# Patient Record
Sex: Male | Born: 1975 | Race: White | Hispanic: No | Marital: Married
Health system: Midwestern US, Community
[De-identification: ages and names within clinical notes are randomized; demographics above are authoritative.]

---

## 2017-11-21 ENCOUNTER — Inpatient Hospital Stay
Admit: 2017-11-21 | Discharge: 2017-11-21 | Payer: PRIVATE HEALTH INSURANCE | Attending: Family | Primary: Family Medicine

## 2017-11-21 DIAGNOSIS — M79674 Pain in right toe(s): Secondary | ICD-10-CM

## 2017-11-21 MED ORDER — HYDROCODONE-ACETAMINOPHEN 5-325 MG PO TABS
5-325 MG | ORAL_TABLET | Freq: Two times a day (BID) | ORAL | 0 refills | Status: AC | PRN
Start: 2017-11-21 — End: 2018-01-13

## 2017-11-21 NOTE — Progress Notes (Addendum)
Nursing Admission Record    Current Issues / Falls / ER Visits:  Yes   Yes kidney stones    Percentage of Pain Relief after Last Procedure:  na %    How long lasted:  0 days    Opioids Prescribed: Yes    Was Medication Brought to Appt: no    Hx of any Neck/Back Surgeries? no    Is Patient currently taking a blood thinner? no    MRI exams received in the past 2 years:  No       When                                               Where       Imaging on chart: No         Imaging records requested: No    CT exam received during the last 12 months: Yes abd and pelvis       When 07/19                                           Where [facility]       Imaging on chart: Yes         Imaging records requested: No    X-ray exam received during the last 12 months: Yeschest       When 7/19                                              [facility]        Imaging on chart: No         Imaging records requested: No    Nerve Conduction Study/EMG:  No       When                                               Where       Imaging on chart: No         Imaging records requested: No    Physical therapy during the last 6 months: No       When:                         Where     Labs during the last 12 months: Yes       When: 11/21/17                                             Where  [facility]    PEG Score: 9    Last PEG Score: na  Annual ORT Score: 4    Annual PHQ Score:5  Last UDS Results: 11/21/17    Education Provided:  [x]  Review of [facility]  [x]  Agreement Review  [x]  PEG Score Calculated [x]  PHQ Score Calculated [x]  ORT Score Calculated    []  Compliance Issues  Discussed []  Cognitive Behavior Needs [x]  Exercise []  Review of Test []  Financial Issues  [x]  Tobacco/Alcohol Use Reviewed [x]  Teaching []  New Patient [x]  Picture Obtained    Physician Plan:  []  Outgoing Referral  []  Pharmacy Consult  [x]  Test Ordered [x]  Prescription Ordered/Changed x1 []  Blood Thinner Request Form  []  Obtained Test Results / Consult Notes  []  UDS due at next visit,  verified per EPIC      []  Suspected Physical Abuse or Suicide Risk assessed - IF YES COMPLETE QUESTIONS BELOW    If any of the following questions are answered yes - contact attending physician for referral:    Has been considering harming self to escape stress, pain problems?  []  YES  []  NO  Has a suicide plan? []  YES  []  NO  Has attempted suicide in the past?   []  YES  []  NO  Has a close friend or family member who committed suicide?  []  YES  []  NO    Assessment Completed by:  Electronically signed by Lucy Antigua, RN on 11/21/2017 at 1:37 PM

## 2017-11-21 NOTE — H&P (Signed)
[facility]    History and Physical    Patient Name: Joshua Bush    MR #: [id number]    Account [id number]    DOB: [birth year]    Age: 42 y.o.    Sex: male    Date: 11/21/2017    PCP: Keene Breath, MD    Referring Provider: Primary care    Chief Complaint:   Chief Complaint   Patient presents with   ??? Foot Pain     feet bilateral       History of Present Illness:   The patient is a 42 y.o. male who presents with referral from primary care which I do appreciate with primary complaints of planes of chronic bilateral leg and feet pain.  No loss of bowel bladder.  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  Patient reports chronic history of lower leg and feet pain.  Physical therapy in the past -opiate medications.  Patient referral from primary care after "aberrant drug screen from primary care apparently took oxycodone from a family member".  Patient recently released from Dr. 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  Currently taken Cymbalta, Neurontin, and one prescription of Norco 5 tapering dose from primary care.       Current PEG: 9    Annual Screens: Primary care    ORT Score: 4    PHQ-9 Score: 5    Current Pain Assessment  Pain Assessment  Pain Assessment: 0-10  Pain Level: 10  Patient's Stated Pain Goal: No pain  Pain Type: Chronic pain  Pain Location: Foot(bilateral)  Pain Descriptors: Stabbing, Throbbing  Pain Frequency: Continuous  Pain Onset: On-going  Clinical Progression: Gradually worsening  Functional Pain Assessment: Prevents or interferes some active activities and ADLs  Non-Pharmaceutical Pain Intervention(s): (bath)    Employment:  nka    Previous Injury: No    Previous Physical Therapy In the last 6 months? No    Did Physical Therapy make the pain better?  No     MRI in the two last year?  No  OF: nka see images below    CT scan in last 12 months? Yes    X-ray last 12 months? Yes    Nerve Conduction Study / EMG No    Injections in the past? Yes    Did the injections help relieve the pain? No    Past Medical History  Past Medical History:   Diagnosis Date   ??? Arthritis    ??? Arthrogryposis    ??? CAD (coronary artery disease)    ??? GERD (gastroesophageal reflux disease)    ??? Hyperlipidemia    ??? Hypertension        Medications  Current Outpatient Medications   Medication Sig Dispense Refill   ??? omeprazole (PRILOSEC) 40 MG delayed release capsule TAKE 1 CAPSULE BY MOUTH ONCE DAILY     ??? metoprolol succinate (TOPROL XL) 50 MG extended release tablet Take 50 mg by mouth     ??? varenicline (CHANTIX) 1 MG tablet      ??? tamsulosin (FLOMAX) 0.4 MG capsule Take 0.4 mg by mouth     ??? DULoxetine (CYMBALTA) 60 MG extended release  capsule Take 60 mg by mouth     ??? gabapentin (NEURONTIN) 100 MG capsule Take 100 mg by mouth 2 times daily.     ??? [START ON 12/14/2017] HYDROcodone-acetaminophen (NORCO) 5-325 MG per tablet Take 1 tablet by mouth 2 times daily as needed for Pain for up to 30 days. 60 tablet 0   ??? rOPINIRole (REQUIP) 0.5 MG tablet TAKE 1 TABLET BY MOUTH NIGHTLY ONE HOUR BEFORE BEDTIME  0   ??? aspirin EC 81 MG EC tablet Take 81 mg by mouth     ??? nitroGLYCERIN (NITROSTAT) 0.4 MG SL tablet DISSOLVE ONE TABLET UNDER THE TONGUE EVERY 5 MINUTES AS NEEDED FOR CHEST PAIN. DO NOT EXCEED A TOTAL OF 3 DOSES IN 15 MINUTES  2   ??? ondansetron (ZOFRAN) 4 MG tablet TAKE 1 TABLET BY MOUTH 4 TIMES DAILY  1   ??? atorvastatin (LIPITOR) 80 MG tablet TAKE 1 TABLET BY MOUTH ONCE DAILY  11   ??? ranitidine (ZANTAC) 150 MG tablet TAKE 1 TABLET BY MOUTH AT NIGHT  2   ??? BRILINTA 60 MG TABS tablet TAKE 1 TABLET BY MOUTH TWICE DAILY  11     No current facility-administered medications  for this encounter.        Allergies  Latex; Dust mite extract; Peanut-containing drug products; Shrimp flavor; and Walnut [macadamia nut oil]    Current Medications  Current Outpatient Medications   Medication Sig Dispense Refill   ??? omeprazole (PRILOSEC) 40 MG delayed release capsule TAKE 1 CAPSULE BY MOUTH ONCE DAILY     ??? metoprolol succinate (TOPROL XL) 50 MG extended release tablet Take 50 mg by mouth     ??? varenicline (CHANTIX) 1 MG tablet      ??? tamsulosin (FLOMAX) 0.4 MG capsule Take 0.4 mg by mouth     ??? DULoxetine (CYMBALTA) 60 MG extended release capsule Take 60 mg by mouth     ??? gabapentin (NEURONTIN) 100 MG capsule Take 100 mg by mouth 2 times daily.     ??? [START ON 12/14/2017] HYDROcodone-acetaminophen (NORCO) 5-325 MG per tablet Take 1 tablet by mouth 2 times daily as needed for Pain for up to 30 days. 60 tablet 0   ??? rOPINIRole (REQUIP) 0.5 MG tablet TAKE 1 TABLET BY MOUTH NIGHTLY ONE HOUR BEFORE BEDTIME  0   ??? aspirin EC 81 MG EC tablet Take 81 mg by mouth     ??? nitroGLYCERIN (NITROSTAT) 0.4 MG SL tablet DISSOLVE ONE TABLET UNDER THE TONGUE EVERY 5 MINUTES AS NEEDED FOR CHEST PAIN. DO NOT EXCEED A TOTAL OF 3 DOSES IN 15 MINUTES  2   ??? ondansetron (ZOFRAN) 4 MG tablet TAKE 1 TABLET BY MOUTH 4 TIMES DAILY  1   ??? atorvastatin (LIPITOR) 80 MG tablet TAKE 1 TABLET BY MOUTH ONCE DAILY  11   ??? ranitidine (ZANTAC) 150 MG tablet TAKE 1 TABLET BY MOUTH AT NIGHT  2   ??? BRILINTA 60 MG TABS tablet TAKE 1 TABLET BY MOUTH TWICE DAILY  11     No current facility-administered medications for this encounter.        Social History    Social History     Socioeconomic History   ??? Marital status: Married     Spouse name: None   ??? Number of children: None   ??? Years of education: None   ??? Highest education level: None   Occupational History   ??? None   Social Needs   ???  Financial resource strain: None   ??? Food insecurity:     Worry: None     Inability: None   ??? Transportation needs:     Medical: None     Non-medical: None    Tobacco Use   ??? Smoking status: Current Every Day Smoker     Packs/day: 0.50   ??? Smokeless tobacco: Never Used   Substance and Sexual Activity   ??? Alcohol use: Not Currently     Frequency: Never   ??? Drug use: Never   ??? Sexual activity: None   Lifestyle   ??? Physical activity:     Days per week: None     Minutes per session: None   ??? Stress: None   Relationships   ??? Social connections:     Talks on phone: None     Gets together: None     Attends religious service: None     Active member of club or organization: None     Attends meetings of clubs or organizations: None     Relationship status: None   ??? Intimate partner violence:     Fear of current or ex partner: None     Emotionally abused: None     Physically abused: None     Forced sexual activity: None   Other Topics Concern   ??? None   Social History Narrative   ??? None         Family History  family history is not on file.    Review of Systems:  Review of Systems   Constitutional: Positive for fatigue. Negative for chills and fever.   HENT: Negative for nosebleeds and sore throat.    Eyes: Negative for pain and redness.   Respiratory: Negative for shortness of breath and wheezing.    Cardiovascular: Negative for chest pain.   Gastrointestinal: Negative for abdominal pain and constipation.   Genitourinary: Negative for dysuria and frequency.   Musculoskeletal: Positive for arthralgias ( History congenital bilateral lower leg bone issues.), gait problem and myalgias.   Skin: Negative for rash.   Neurological: Negative for seizures and syncope.   Hematological: Does not bruise/bleed easily.   Psychiatric/Behavioral: Negative for dysphoric mood and suicidal ideas.   All other systems reviewed and are negative.       14 point ROS negative besides that noted in HPI      No data found.    Body mass index is 32.82 kg/m??.    Physical Exam   Constitutional: He is oriented to person, place, and time. He appears well-developed and well-nourished. No distress.   HENT:   Head:  Normocephalic.   Right Ear: External ear normal.   Left Ear: External ear normal.   Nose: Nose normal.   Eyes: Pupils are equal, round, and reactive to light. Conjunctivae and EOM are normal. Right eye exhibits no discharge. Left eye exhibits no discharge.   Neck: Normal range of motion. Neck supple.   Cardiovascular: Normal rate and regular rhythm.   Pulmonary/Chest: Effort normal and breath sounds normal. He has no wheezes.   Abdominal: Soft. Bowel sounds are normal. There is no tenderness. There is no guarding.   Musculoskeletal: He exhibits tenderness.        Left ankle: Tenderness.        Left forearm: He exhibits tenderness.        Right lower leg: He exhibits tenderness.        Left lower leg: He exhibits tenderness.  Legs:  Neurological: He is alert and oriented to person, place, and time. He displays atrophy (Bilateral lower legs). He exhibits normal muscle tone. Coordination and gait normal.   Reflex Scores:       Tricep reflexes are 2+ on the right side and 2+ on the left side.       Bicep reflexes are 2+ on the right side and 2+ on the left side.       Brachioradialis reflexes are 2+ on the right side and 2+ on the left side.  Surgical scar left wrist-weakness    With surgical bilateral ankle scarring flexed position-bilateral strap on bracing   Skin: Skin is warm and dry. No rash noted. He is not diaphoretic. No erythema.   Psychiatric: His behavior is normal. Judgment and thought content normal. His mood appears anxious. He is not aggressive and not hyperactive. He expresses no homicidal and no suicidal ideation.   Alert and verbal   Nursing note and vitals reviewed.      LAST UDS: Patient with oxycodone positive UDS from primary care recently, released from pain management recently from methamphetamine positive UDS.  Will obtain UDS today    LABS:     No results found for: NA, K, CL, CO2, BUN, CREATININE, GLUCOSE, CALCIUM     No results found for: WBC, HGB, HCT, MCV, PLT    Recent  NVC/EMG:nka    Recent Imaging:nka                                                                                                                                       Principal Problem:    Chronic pain of both lower extremities  Active Problems:    Chronic pain of toes of both feet    Pain management    Pain in joints of both feet    Arthrogryposis  Resolved Problems:    * No resolved hospital problems. *      PLAN:  1.  I discussed the opiate prescribing policy of this facility and FDA guidelines.  2.  Patient will sign for last pain management images and notes  3.  I suggested that he maintains behavioral health group follow-up  4.  Obtain UDS today.  5. Will overtake norco 5mg  BID - #60- pt understands this may be titration pending UDS.   6. Pt is interested in Bilateral ankle scarring TPI- Dr. Venetia Night ( pt perfers lidocaine only).   7.  Will obtain bilateral ankle xray.   8. Follow up 1 month.    Discussion: Discussed exam findings and plan of care with patient. Patient agreed with POC and questions were asked and answered.  New patient evaluation today appreciate primary care.  Patient with a history of congenital lower extremity issue.  Multiple surgeries bilateral lower feet.  She was on for last pain management notes and images.  Will over take Norco 5 mg  twice daily #60 we discussed titration of this medication as we obtained UDS today.  Patient is interested in bilateral ankle scarring trigger point injections pain only.  With supposedly apparent oxycodone positive from primary care UDS and apparently methamphetamine positive from last pain management.  Discussed any aberrancy may lead to discharge at this facility or of opiate medications.  Patient agrees to move forward    Activity: discussed exercise as beneficial to pain reduction, encouraged stretching exercise with a focus ontorso strengthening.    Education Provided by Provider  Review of Zadie Rhine / Agreement Review / ORT Calculated / PHQ-9  Reviewed / Compliance Issues Discussed / Cognitive Behavior Needs / Exercise / Review of Test / Financial Issues / Teaching / Smoking Cessation / Tobacco/Alcohol Use    Controlled Substance Monitoring:  Discussed possible narcotic pain medication side effects, risk of tolerance and/or dependence, and alternative treatments. Discussed the effects of long term narcotic use. Patient encouraged to set daily goals of exercising and if on narcotics to decrease daily narcotic intake.     CC:  Keene Breath, MD    Thank you for this kind referral and allowing me to participate in your patients care.    Derek Mound, APRN, 12/02/2017 at10:31 AM    EMR dragon/transcription disclaimer: Much of this encounter note is electronic transcription/translation of spoken language to printed tach. Electronic translation of spoken language may be erroneous, or at times, nonsensical words or phrases may be inadvertently transcribed. Although, I have reviewed the note for such errors, some may still exist.

## 2017-12-04 ENCOUNTER — Inpatient Hospital Stay: Admit: 2017-12-04 | Payer: PRIVATE HEALTH INSURANCE | Primary: Family Medicine

## 2017-12-04 DIAGNOSIS — M79604 Pain in right leg: Secondary | ICD-10-CM

## 2017-12-06 ENCOUNTER — Inpatient Hospital Stay
Admit: 2017-12-06 | Payer: PRIVATE HEALTH INSURANCE | Attending: Physical Medicine & Rehabilitation | Primary: Family Medicine

## 2017-12-06 DIAGNOSIS — M25572 Pain in left ankle and joints of left foot: Secondary | ICD-10-CM

## 2017-12-06 MED ORDER — BUPIVACAINE HCL (PF) 0.5 % IJ SOLN
0.5 % | Freq: Once | INTRAMUSCULAR | Status: AC | PRN
Start: 2017-12-06 — End: 2017-12-06
  Administered 2017-12-06: 13:00:00 7 via INTRADERMAL

## 2017-12-06 MED ORDER — BUPIVACAINE HCL (PF) 0.5 % IJ SOLN
0.5 | INTRAMUSCULAR | Status: AC
Start: 2017-12-06 — End: 2017-12-06

## 2017-12-06 MED ORDER — LIDOCAINE HCL (PF) 1 % IJ SOLN
1 % | Freq: Once | INTRAMUSCULAR | Status: AC | PRN
Start: 2017-12-06 — End: 2017-12-06
  Administered 2017-12-06: 13:00:00 7 via INTRADERMAL

## 2017-12-06 MED ORDER — LIDOCAINE HCL (PF) 1 % IJ SOLN
1 | INTRAMUSCULAR | Status: AC
Start: 2017-12-06 — End: 2017-12-06

## 2017-12-06 MED FILL — LIDOCAINE HCL (PF) 1 % IJ SOLN: 1 % | INTRAMUSCULAR | Qty: 30

## 2017-12-06 MED FILL — SENSORCAINE-MPF 0.5 % IJ SOLN: 0.5 % | INTRAMUSCULAR | Qty: 10

## 2017-12-06 NOTE — H&P (Signed)
See H&P in chart

## 2017-12-06 NOTE — Interval H&P Note (Signed)
 H&P Update     Patient examined.    There has been no change.    Joshua Bush

## 2017-12-09 NOTE — Telephone Encounter (Signed)
 Dr. Venetia Night Follow up calls       1.  How are you feeling since your injection?  Doing pretty bad, The second day he walked a little bit but has since he has been in bed.    2.   Pain Score:  Prior-  8           Now-  10    3. Do you feel like Dr. Venetia Night got to the source of your pain?  First day could walk for an hour.    4. Did it relieve at least 80% or more of your pain?  no    5. Does the pain still go down one leg or arm or both legs or arms?  Both feet hurting

## 2017-12-11 NOTE — Telephone Encounter (Signed)
Sending non narcotic letter per NP.  UDS positive for benzos, gabapentin, and oxymorphone which are not prescribed per [facility] report.     Per Joshua Bush it is okay to give him a weaning dose of norco 5 mg 1 daily for 2 weeks if the patient calls for a refill.

## 2017-12-25 ENCOUNTER — Inpatient Hospital Stay
Admit: 2017-12-25 | Discharge: 2017-12-25 | Payer: PRIVATE HEALTH INSURANCE | Attending: Family | Primary: Family Medicine

## 2017-12-25 DIAGNOSIS — M25572 Pain in left ankle and joints of left foot: Secondary | ICD-10-CM

## 2017-12-25 NOTE — Progress Notes (Addendum)
[facility] Physical & Pain Medicine  Office Note    Patient Name: Joshua Bush    MR #: [id number]    Account #: [id number]    DOB: 12-26-1975    Age: 42 y.o.    Sex: male    Date: 12/25/2017    PCP: Keene Breath, MD    Chief Complaint:   Chief Complaint   Patient presents with   ??? Ankle Pain     bilateral       History of Present Illness:     Joshua Bush is a 42 y.o. male who presents to the office for chronic complaint of lower leg and feet pain.  Was a new patient 11/21/2017 history congenital arthrogryposis.  Patient had aberrant drug screen from primary care "oxycodone from a family member".  Was seen pain management Dr. Valinda Hoar " mephampetamine positive".  UDS was obtained with apparent results "benzodiazepines, gabapentin and oxymorphone".  And nonnarcotic letter sent to patient. he does report bilateral scarring ankle injections via Dr. Lillia Carmel help for 1 hour.  Patient with wife in room I asked for permission to speak he agrees that "whatever you say she got hear to".  Patient has successfully titrated off the Norco.  He asked why we did not refill the Norco.  I asked about his "nonnarcotic letter he got in the mail".  Patient and wife report "they never received anything in the mail".    Past Visit HPI: I have read last pain management note    Current PEG:9    Past PEG: 9    Annual Screens: Primary care Dr. Larinda Buttery    ORT Score:4-patient with apparent drug screen primary care, pain management, and at this facility    PHQ-9 Score: 5    Current Pain Assessment  Pain Assessment  Pain Assessment: 0-10  Pain Level: 10  Patient's Stated Pain Goal: No pain  Pain Type: Chronic pain  Pain Location: Ankle  Pain Orientation: (bilateral)  Pain Descriptors: Aching, Stabbing(feels like ankles are in a vice)  Pain Frequency: Continuous  Pain Onset: On-going  Clinical Progression: Gradually worsening  Functional Pain Assessment: Prevents or interferes some active activities and ADLs    Employment:     Past  Medical Histoy  Past Medical History:   Diagnosis Date   ??? Arthritis    ??? Arthrogryposis    ??? CAD (coronary artery disease)    ??? GERD (gastroesophageal reflux disease)    ??? Hyperlipidemia    ??? Hypertension        Surgery History  Past Surgical History:   Procedure Laterality Date   ??? CHOLECYSTECTOMY, LAPAROSCOPIC     ??? OTHER SURGICAL HISTORY      6 surguries on lt fott 4 on rt foot 2 on lt wrist        Family History  family history is not on file.    Social History    Social History     Tobacco Use   ??? Smoking status: Current Every Day Smoker     Packs/day: 0.50   ??? Smokeless tobacco: Never Used   Substance Use Topics   ??? Alcohol use: Not Currently     Frequency: Never       Allergies  Latex; Dust mite extract; Peanut-containing drug products; Shrimp flavor; Shrimp extract allergy skin test; and Walnut [macadamia nut oil]     Current Medications  Current Outpatient Medications   Medication Sig Dispense Refill   ??? omeprazole (PRILOSEC)  40 MG delayed release capsule TAKE 1 CAPSULE BY MOUTH ONCE DAILY     ??? metoprolol succinate (TOPROL XL) 50 MG extended release tablet Take 50 mg by mouth     ??? varenicline (CHANTIX) 1 MG tablet      ??? rOPINIRole (REQUIP) 0.5 MG tablet TAKE 1 TABLET BY MOUTH NIGHTLY ONE HOUR BEFORE BEDTIME  0   ??? aspirin EC 81 MG EC tablet Take 81 mg by mouth     ??? tamsulosin (FLOMAX) 0.4 MG capsule Take 0.4 mg by mouth     ??? DULoxetine (CYMBALTA) 60 MG extended release capsule Take 60 mg by mouth     ??? nitroGLYCERIN (NITROSTAT) 0.4 MG SL tablet DISSOLVE ONE TABLET UNDER THE TONGUE EVERY 5 MINUTES AS NEEDED FOR CHEST PAIN. DO NOT EXCEED A TOTAL OF 3 DOSES IN 15 MINUTES  2   ??? ondansetron (ZOFRAN) 4 MG tablet TAKE 1 TABLET BY MOUTH 4 TIMES DAILY  1   ??? atorvastatin (LIPITOR) 80 MG tablet TAKE 1 TABLET BY MOUTH ONCE DAILY  11   ??? ranitidine (ZANTAC) 150 MG tablet TAKE 1 TABLET BY MOUTH AT NIGHT  2   ??? BRILINTA 60 MG TABS tablet TAKE 1 TABLET BY MOUTH TWICE DAILY  11   ??? gabapentin (NEURONTIN) 100 MG  capsule Take 100 mg by mouth 2 times daily.     ??? HYDROcodone-acetaminophen (NORCO) 5-325 MG per tablet Take 1 tablet by mouth 2 times daily as needed for Pain for up to 30 days. 60 tablet 0     No current facility-administered medications for this encounter.        Review of Systems:  Review of Systems   Constitutional: Negative for chills and fever.        WC for long distance mobility   HENT: Negative for nosebleeds and sore throat.    Eyes: Negative for pain.   Respiratory: Negative for shortness of breath and wheezing.    Cardiovascular: Negative for chest pain.   Gastrointestinal: Negative for abdominal pain and constipation.   Genitourinary: Negative for dysuria and frequency.   Musculoskeletal: Positive for arthralgias ( Bilateral lower leg congenital), gait problem and myalgias.   Skin: Negative for rash.   Neurological: Negative for seizures and syncope.   Hematological: Does not bruise/bleed easily.   Psychiatric/Behavioral: Positive for agitation. Negative for suicidal ideas.       14 point ROS negative besides that noted in HPI    Physical Exam:    Vitals:    12/25/17 1349   BP: 95/64   Pulse: 93   Resp: 16   Temp: 97.7 ??F (36.5 ??C)   Weight: 187 lb 4 oz (84.9 kg)   Height: 5\' 5"  (1.651 m)       Body mass index is 31.16 kg/m??.    Physical Exam   Constitutional: He is oriented to person, place, and time. He appears well-developed and well-nourished. No distress.   HENT:   Head: Normocephalic.   Right Ear: External ear normal.   Left Ear: External ear normal.   Nose: Nose normal.   Eyes: Pupils are equal, round, and reactive to light. Conjunctivae and EOM are normal. Right eye exhibits no discharge. Left eye exhibits no discharge.   Neck: Normal range of motion. Neck supple.   Cardiovascular: Normal rate and regular rhythm.   Pulmonary/Chest: Effort normal and breath sounds normal. No stridor. He has no wheezes.   Abdominal: Soft. Bowel sounds are normal. There is  no tenderness.   Musculoskeletal: He  exhibits tenderness and deformity.        Right ankle: Tenderness.        Left ankle: Tenderness.        Right foot: There is tenderness.        Left foot: There is tenderness.   Patient wearing shoes and boots   Neurological: He is alert and oriented to person, place, and time. Gait abnormal. Coordination normal.   Reflex Scores:       Tricep reflexes are 2+ on the right side and 2+ on the left side.       Bicep reflexes are 2+ on the right side and 2+ on the left side.       Brachioradialis reflexes are 2+ on the right side and 2+ on the left side.  Patient wearing shoes and boots   Skin: Skin is warm and dry. No rash noted. He is not diaphoretic. No erythema.   Psychiatric: His behavior is normal. Judgment and thought content normal. His mood appears anxious. He is not aggressive and not hyperactive. He expresses no homicidal and no suicidal ideation.   Alert and verbal   Nursing note and vitals reviewed.      LAST UDS: Apparent UDS    Labs:  No results found for: NA, K, CL, CO2, BUN, CREATININE, GLUCOSE, CALCIUM     No results found for: WBC, HGB, HCT, MCV, PLT    Recent Imaging:  EXAMINATION: XR ANKLE LEFT (MIN 3 VIEWS) 12/04/2017 12:47 PM   HISTORY: Chronic bilateral lower extremity pain   COMPARISON: None   FINDINGS:   There is bony destruction of the distal tibia and fibula. There is   collapse of the talus. Talocalcaneal coalition not excluded. No acute   bony abnormality is seen. Degenerative spurring is seen in the   midfoot. There is diffuse soft tissue swelling.   ??   Impression   Advanced degenerative changes of the ankle, which may   posterior chronic in nature. Additionally, Charcot joint could have   this appearance. Correlate clinically.   Signed by Dr Donnalee Curry on 12/04/2017 12:49 PM     EXAMINATION: XR ANKLE RIGHT (MIN 3 VIEWS) 12/04/2017 12:43 PM   HISTORY: Chronic bilateral lower extremity pain   COMPARISON: None   FINDINGS:   There appear to be posttraumatic changes of the distal tibia. The    ankle mortise is somewhat irregular in appearance, with flattening of   the talus at the tibiotalar joint. No joint effusion is identified.   There is degenerative spurring of the medial and lateral malleolus.   There is diffuse soft tissue swelling. A soft tissue calcification is   noted posteriorly.   ??   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 No acute bony abnormality appreciated.   Signed by Dr Donnalee Curry on 12/04/2017 12:45 PM       Assessment:  Principal Problem:    Chronic pain of both lower extremities  Active Problems:    Chronic pain of toes of both feet    Pain management    Abnormal drug screen  Resolved Problems:    * No resolved hospital problems. *      Plan:  1.  Discussed the UDS aberrancy  2.  Discussed scarring tissue trigger point injection and results  3.  I discussed x-ray left and right lower extremity and the possibility of seeing orthopedics "  patient reports he will see them at his own leisure"  4.  I have offered behavioral health and given numbers.  5. I have ordered Dr. Pernell Dupre podiatry referral.  Patient did self discharge after evaluation I gave him the information of Dr. Anabel Halon  6.  Behavioral health information given  7.  Follow-up with provider PCP          Discussion: I have discussed and read last pain management new patient note.  Patient with wife in room.  I did ask for privacy patient reports "whatever you say she has to hear to".  We discussed x-rays I suggested following up with orthopedics.  He reports that trigger point injections to his ankles only lasted for about 1 hour.  We discussed injections with steroids.  I discussed this with Dr. Lillia Carmel.  Would like to refer to Dr. Pernell Dupre podiatry.  Patient then reports he did not get his "nonnarcotic letter" related to his apparent UDS on 11/21/2017.  I discussed this with him.  Wife states "understand and we may not need to see you no more. He needs his medications".   I discussed policy and opiate prescribing.  Patient very cordial he says "self discharge me"       activity: discussed exercise as beneficial to pain reduction, encouraged stretching exercisewith a focus on torso strengthening.    Education Provided by Provider:  Review of Zadie Rhine / Agreement Review / ORT Calculated / PHQ-9 Reviewed / Compliance Issues Discussed / Cognitive Behavior Needs / Exercise / Review of Test / Financial Issues / Teaching / Smoking Cessation / Tobacco/Alcohol Use    Controlled Substance Monitoring:   Discussed with patient possible medication side effects, risk of tolerance, dependenceand alternative treatments. Discussed the growing epidemic in the [place] with the overprescribing and at times the abuse of narcotics. Discussed the detrimental effects of long term narcotic use in younger patients. Patientencouraged to set daily goals of exercising and if on narcotics decreasing daily narcotic intake.   Discussed with the patient about the development of hyperalgesia with long term narcotic intake.     CC:  Keene Breath, MD    Derek Mound, APRN, 12/25/2017 at 2:51 PM    EMR dragon/transcription disclaimer: Much of this encounter note is electronic transcription/translation of spoken language to printed tach. Electronic translation of spoken language may be erroneous, or at times, nonsensical words or phrases may be inadvertently transcribed. Although, I have reviewed the note for such errors, some may still exist.

## 2017-12-25 NOTE — Telephone Encounter (Signed)
 Sending discharge letter per patient's request.

## 2017-12-25 NOTE — Progress Notes (Signed)
Nursing Admission Record  Procedure follow up/ bilateral ankle xrays  Current Issues / Falls / ER Visits:  No    Percentage of Pain Relief after Last Procedure:8/16 bilateral ankle scar infiltration  0 %    How long lasted:  0 days    Opioids Prescribed:yes norco  Was Medication Brought to Appt: No    Hx of any Neck/Back Surgeries? no    Is Patient currently taking a blood thinner? no    MRI exams received in the past 2 years:  No      CT exam received during the last 12 months: Yes abd and pelvis       When 7/19                                  Where [facility]       Imaging on chart: Yes         Imaging records requested: No    X-ray exam received during the last 12 months: Yes chest       When 7/19                           Where [facility]       Imaging on chart: Yes         Imaging records requested: No    Nerve Conduction Study/EMG:  No         Physical therapy during the last 6 months: No        Labs during the last 12 months: Yes       When: 12/25/17                              Where [facility]    PEG Score:10    Last PEG Score: 9    Annual ORT Score:4    Annual PHQ Score: 5    Last UDS Results: 12/25/17    Education Provided:  [x]  Review of [place]  []  Agreement Review  [x]  PEG Score Calculated []  PHQ Score Calculated []  ORT Score Calculated    []  Compliance Issues Discussed []  Cognitive Behavior Needs [x]  Exercise []  Review of Test []  Financial Issues  [x]  Tobacco/Alcohol Use Reviewed [x]  Teaching []  New Patient []  Picture Obtained    Physician Plan:  []  Outgoing Referral  []  Pharmacy Consult  []  Test Ordered []  Prescription Ordered/Changed []  Blood Thinner Request Form  []  Obtained Test Results / Consult Notes  []  UDS due at next visit, verified per EPIC      []  Suspected Physical Abuse or Suicide Risk assessed - IF YES COMPLETE QUESTIONS BELOW    If any of the following questions are answered yes - contact attending physician for referral:    Has been considering harming self to escape stress, pain problems?  []  YES   []  NO  Has a suicide plan? []  YES  []  NO  Has attempted suicide in the past?   []  YES  []  NO  Has a close friend or family member who committed suicide?  []  YES  []  NO    Assessment Completed by:  Electronically signed by Lucy Antigua, RN on 12/25/2017 at 1:20 PM
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
## 2024-02-08 IMAGING — MR MRI PELVIS WITH AND WITHOUT CONTRAST
8 of 11 series · 29 of 48 positions shown · non-contrast
Comparison: None.

FINAL REPORT:
MRI PELVIS WITH AND WITHOUT CONTRAST
Rectal abscess
CLINICAL INDICATION:  Rectal abscess.
TECHNIQUE: Multiplanar and multisequence magnetic resonance imaging of the
pelvis was performed both prior to and following the administration of
intravenous contrast.

[Series 1: survey · axial · 8.0mm · 0.88mm/px · z∈[-48,+225]mm · 2 of 19 slices shown]
[im 1/19]
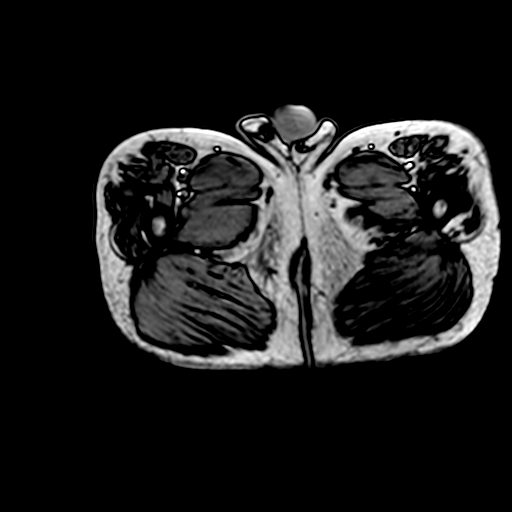
[im 19/19]
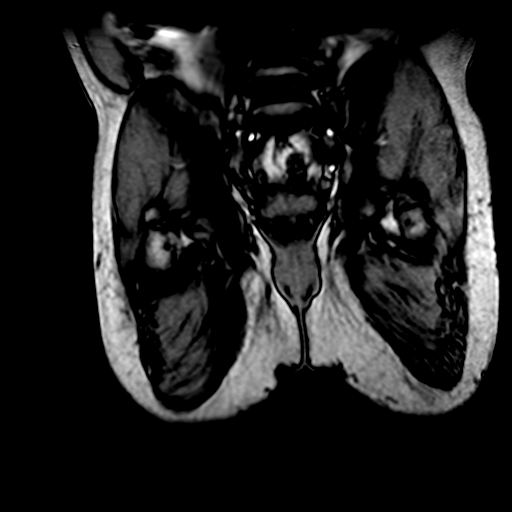

[Series 2: T2 · sagittal · 5.0mm · 0.88mm/px · 3 of 40 slices shown (1 of 3)]
[im 1/40]
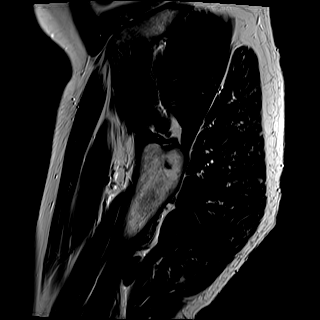
[im 20/40]
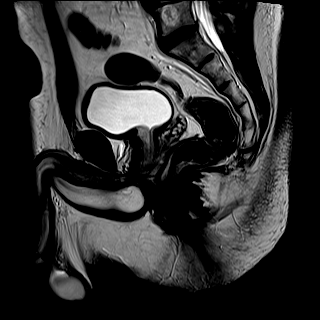
[im 40/40]
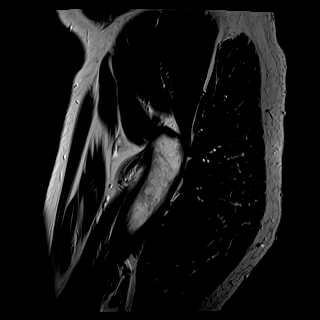

[Series 3: T2 · coronal · 5.0mm · 0.88mm/px · 3 of 40 slices shown (2 of 3)]
[im 1/40]
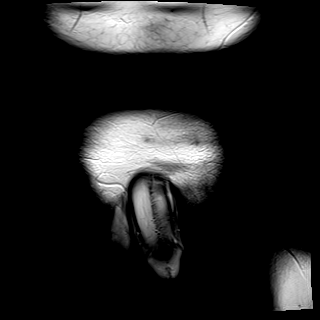
[im 20/40]
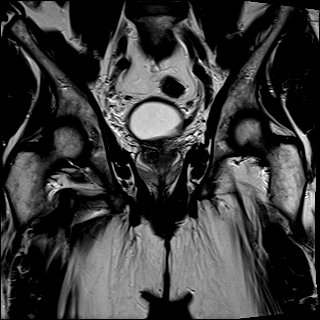
[im 40/40]
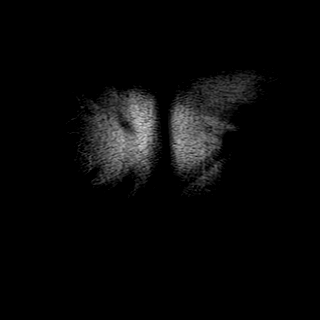

[Series 4: T2 · axial · 5.0mm · 0.47mm/px · z∈[-120,+114]mm · 3 of 40 slices shown (3 of 3)]
[im 1/40]
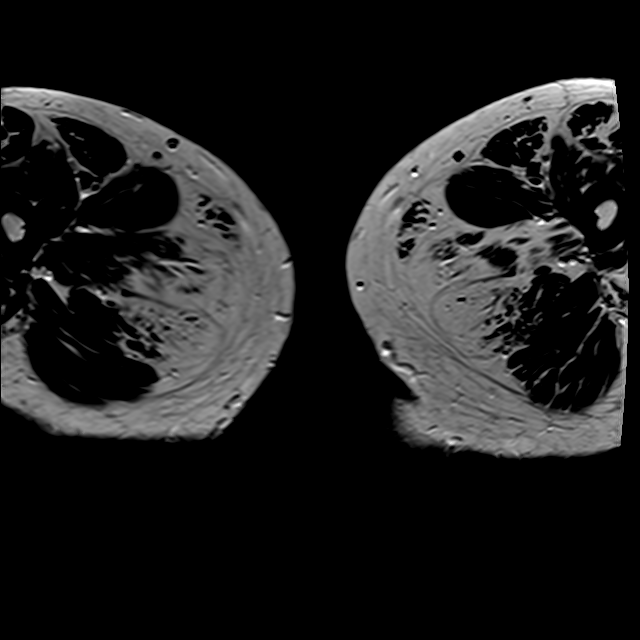
[im 20/40]
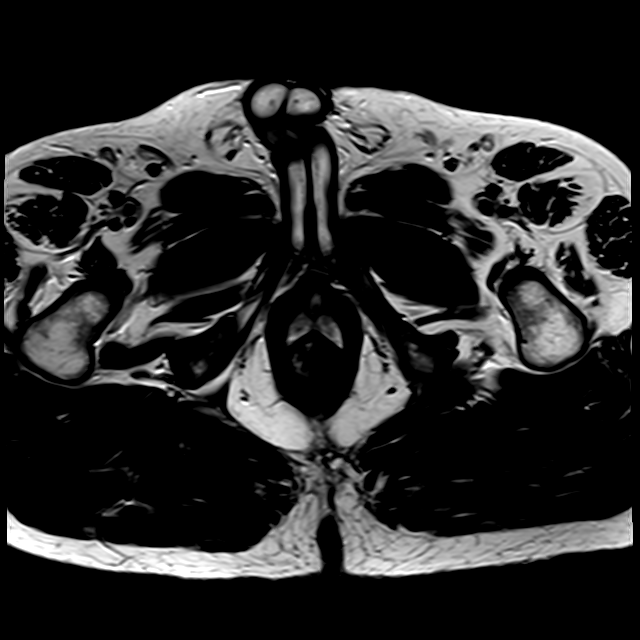
[im 40/40]
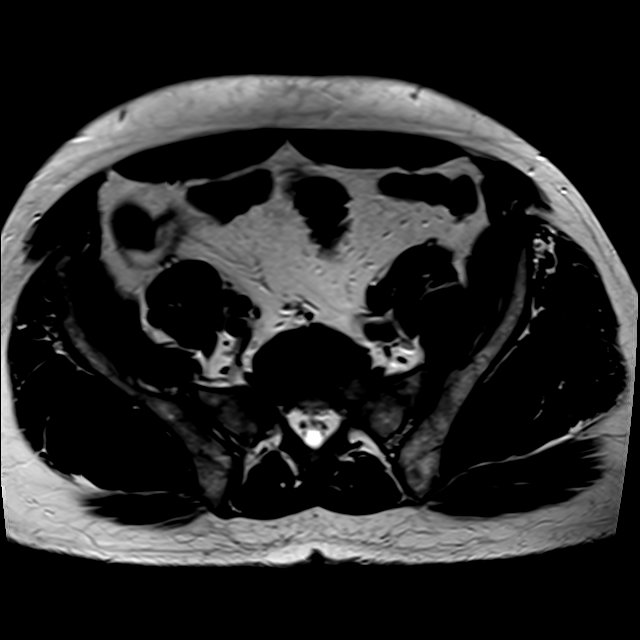

[Series 5: T2 fat-sat · axial · 5.0mm · 0.47mm/px · z∈[-120,+114]mm · 3 of 40 slices shown]
[im 1/40]
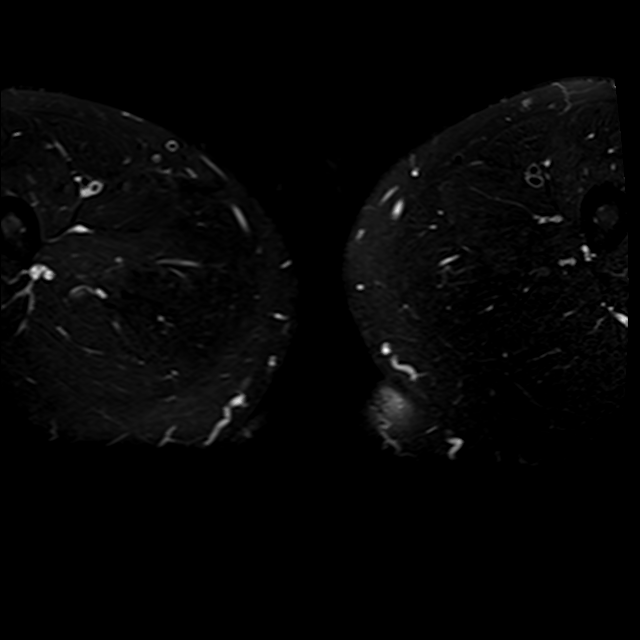
[im 20/40]
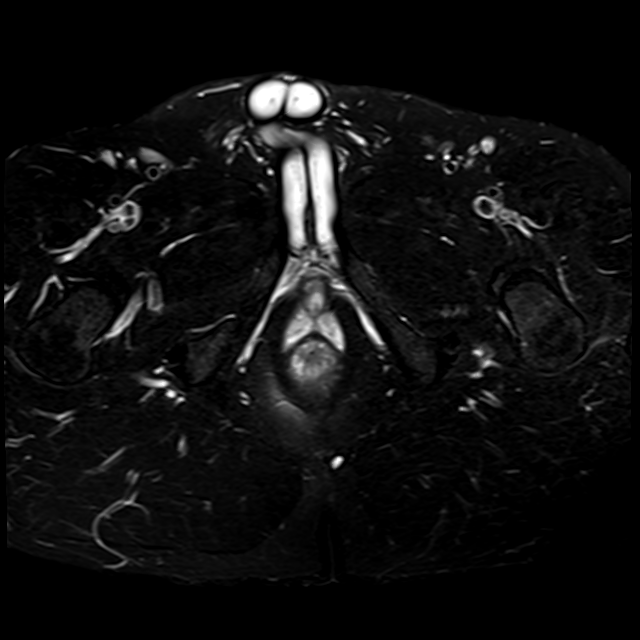
[im 40/40]
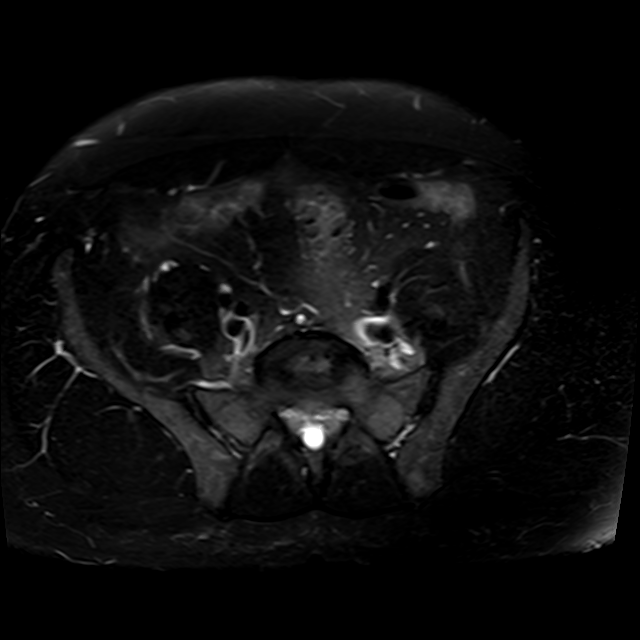

[Series 6: ax dual · axial · 5.0mm · 0.94mm/px · z∈[-120,+114]mm · 7 of 80 slices shown]
[im 1/80]
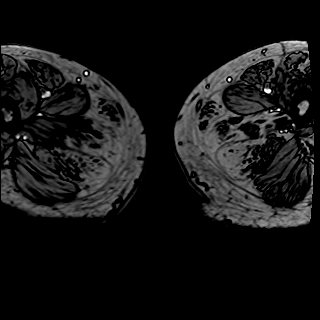
[im 14/80]
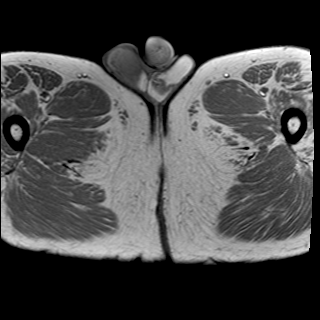
[im 27/80]
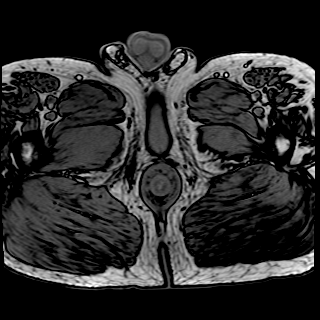
[im 40/80]
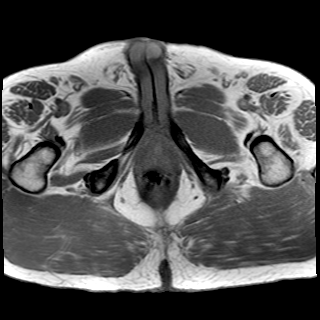
[im 53/80]
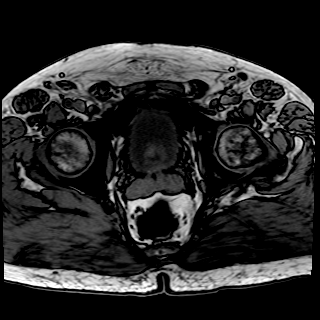
[im 66/80]
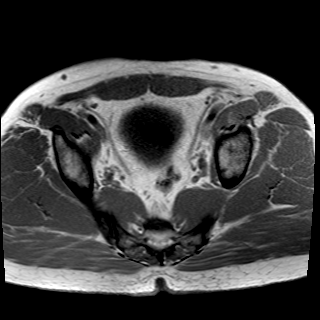
[im 80/80]
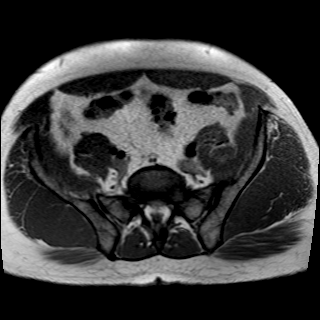

[Series 7: T1 dynamic · axial · non-contrast · 3.0mm · 1.12mm/px · z∈[-108,+129]mm · 7 of 80 slices shown]
[im 1/80]
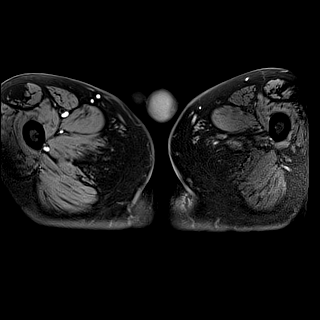
[im 14/80]
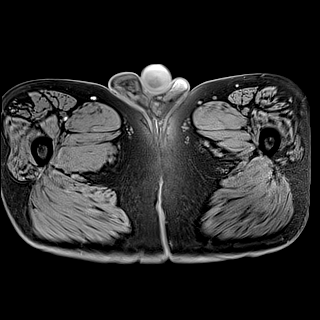
[im 27/80]
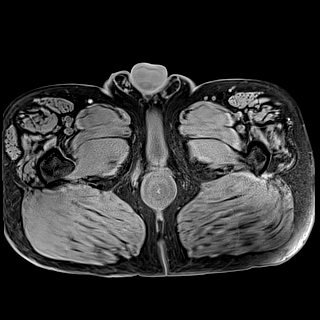
[im 40/80]
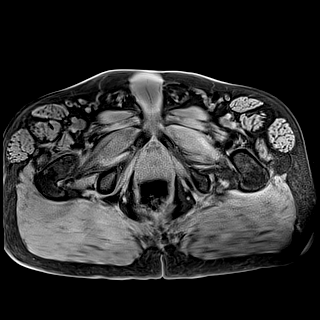
[im 53/80]
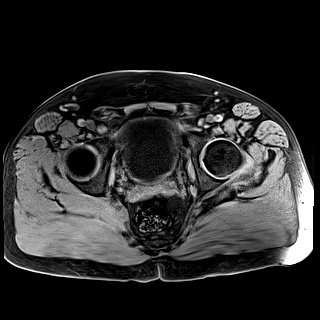
[im 66/80]
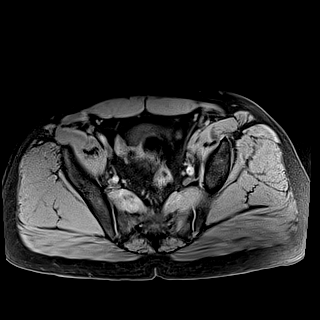
[im 80/80]
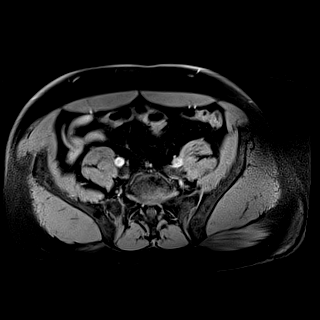

[Series 8: T1 dynamic post-contrast · axial · 3.0mm · 1.12mm/px · 1 of 80 slices shown]
[im 1/80]
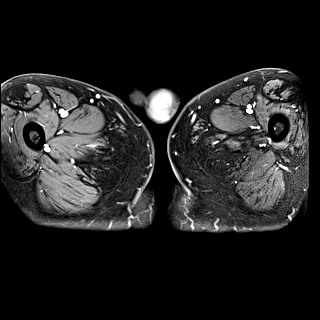

[29 of 48 positions shown; findings below may reference images not displayed]

FINDINGS: Gastrointestinal: Unremarkable.
Lymph Nodes: No pathologically enlarged pelvic or inguinal lymph nodes.
Bladder: Unremarkable.
Pelvic Organs: Prostate and seminal vesicles are unremarkable.
Vessels: Unremarkable.
Peritoneum/Retroperitoneum: Unremarkable.
Bones/Soft Tissues: Enhancing linear tract in the left medial gluteal
superficial subcutaneous soft tissues possibly arising from the left posterior
anal wall at [DATE]. This tract measures approximately 3.7 cm (series 8 image 18,
series 10 image 10). This may correlate with the fistula in question. No
perianal abscess.
IMPRESSION: Enhancing linear tract measuring 3.7 cm in the left medial gluteal soft tissues
possibly arising from the posterior anal wall at [DATE] may represent a perianal
fistula.
[DATE] PMFrom Workstation ID: [id number]
